# Patient Record
Sex: Male | Born: 2000 | Hispanic: Yes | Smoking: Never smoker
Health system: Southern US, Community
[De-identification: ages and names within clinical notes are randomized; demographics above are authoritative.]

---
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
## 2020-02-23 IMAGING — CT CT HEAD W/O CM
3 series · 15 of 47 positions shown, 18 images · non-contrast
Comparison: None.

CLINICAL DATA: Panic attack

EXAM:
CT HEAD WITHOUT CONTRAST
TECHNIQUE: Contiguous axial images were obtained from the base of the skull
through the vertex without intravenous contrast.

[Series 3: head 5.0 h30s · axial · 0.39mm/px · z∈[-76,+49]mm · 9 of 31 slices shown, 12 images]
[im 3/31  brain]
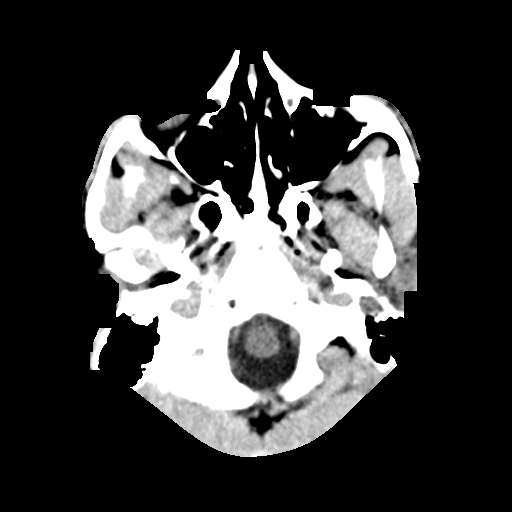
[im 3/31  bone]
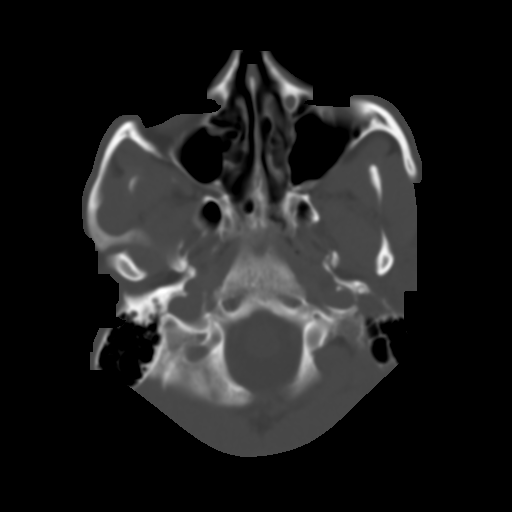
[im 6/31  brain]
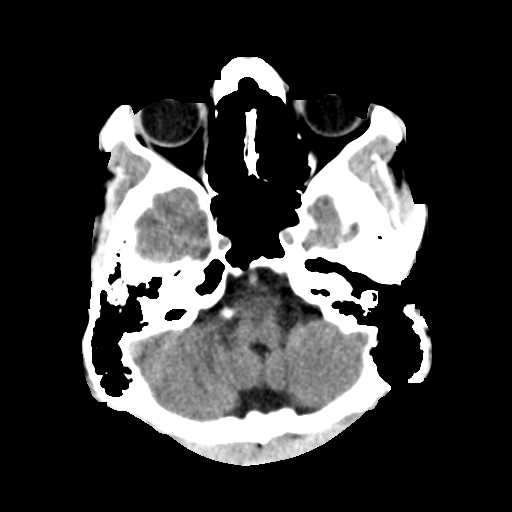
[im 9/31  brain]
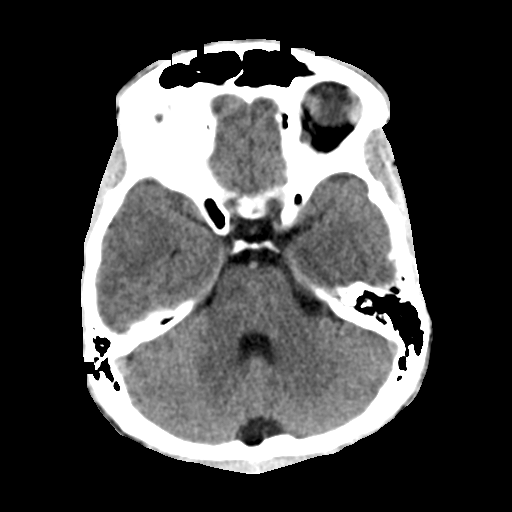
[im 12/31  brain]
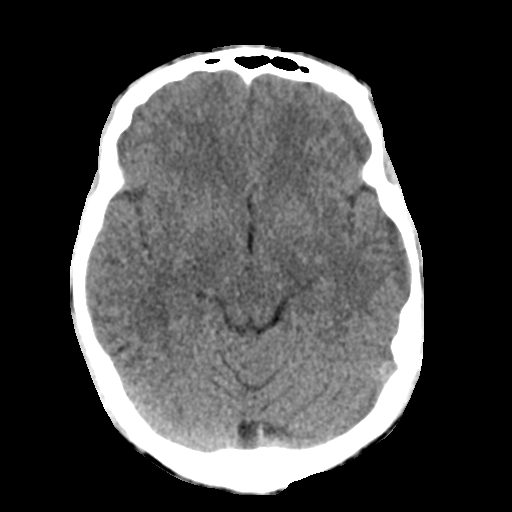
[im 16/31  brain]
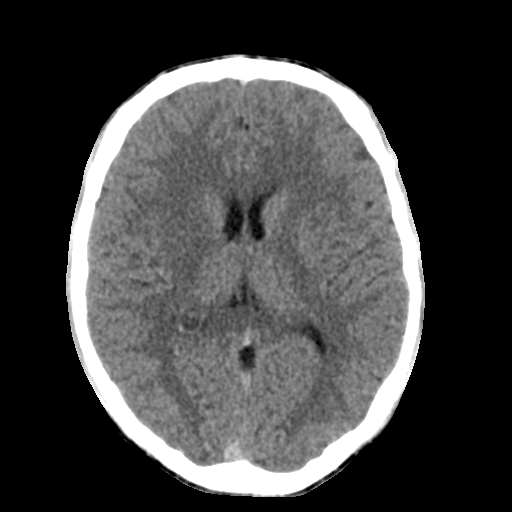
[im 16/31  bone]
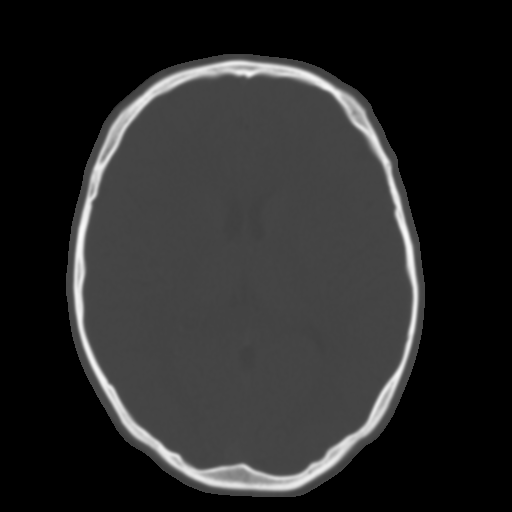
[im 19/31  brain]
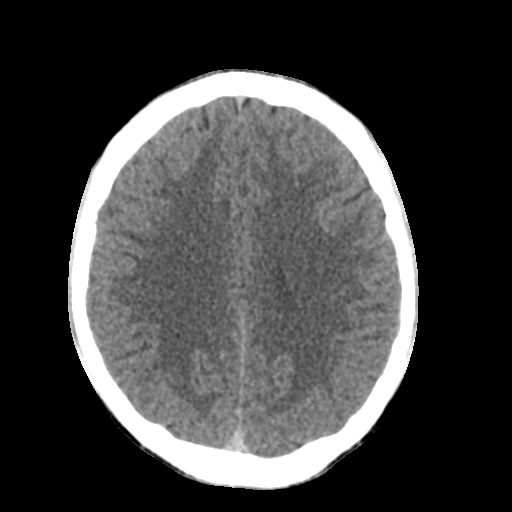
[im 22/31  brain]
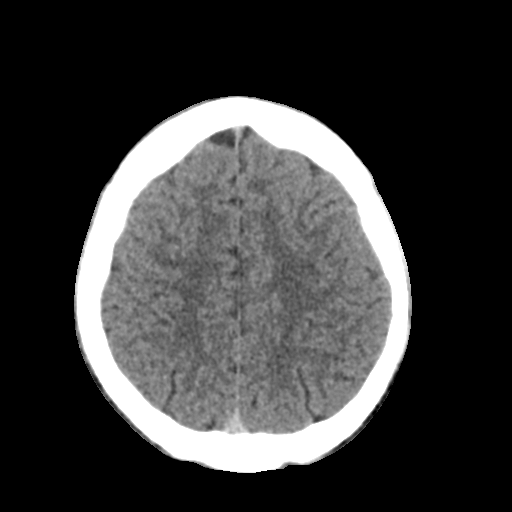
[im 25/31  brain]
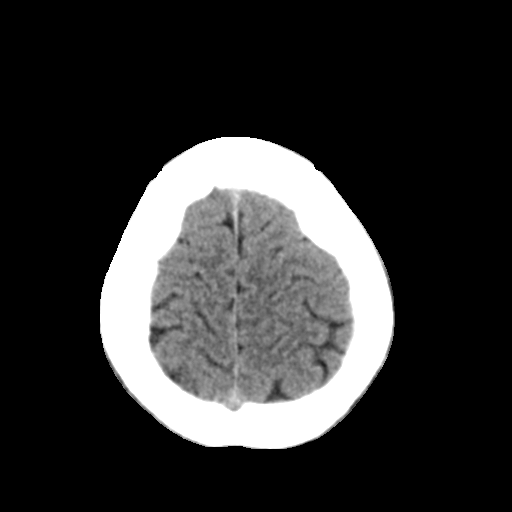
[im 28/31  brain]
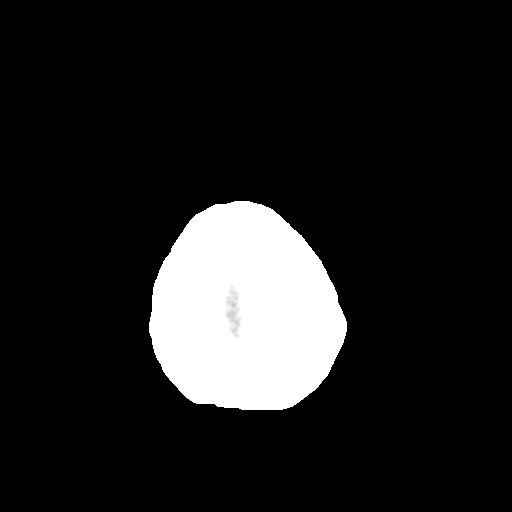
[im 28/31  bone]
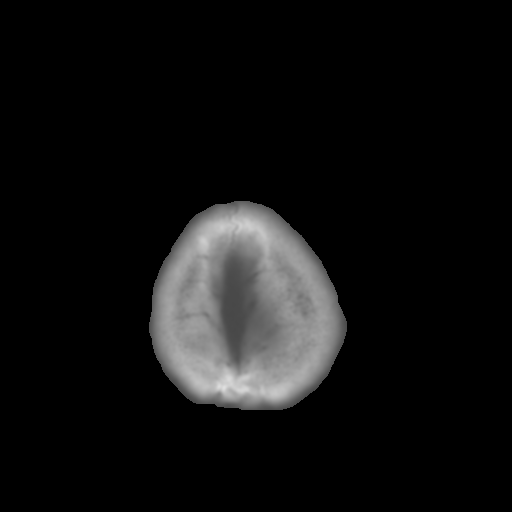

[Series 5: head 3.0 mpr cor · coronal · 0.29mm/px · 3 of 67 slices shown]
[im 23/67  brain]
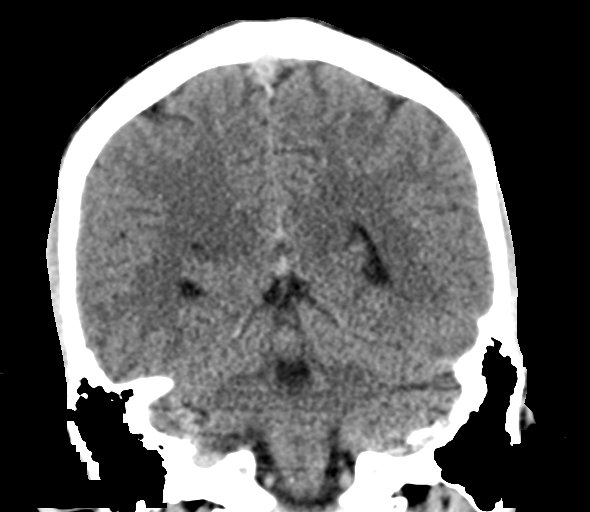
[im 30/67  brain]
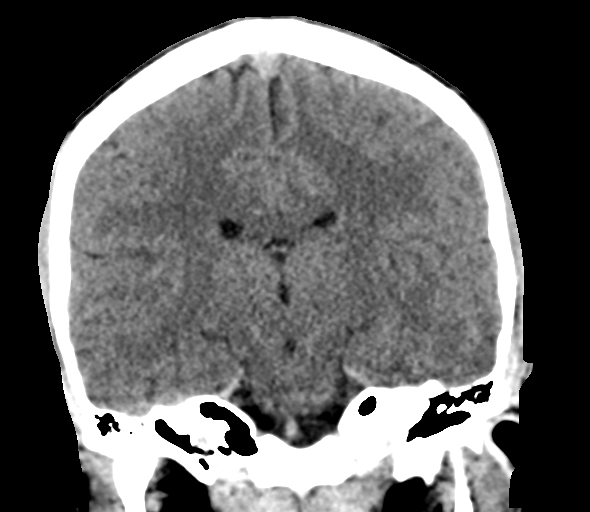
[im 37/67  brain]
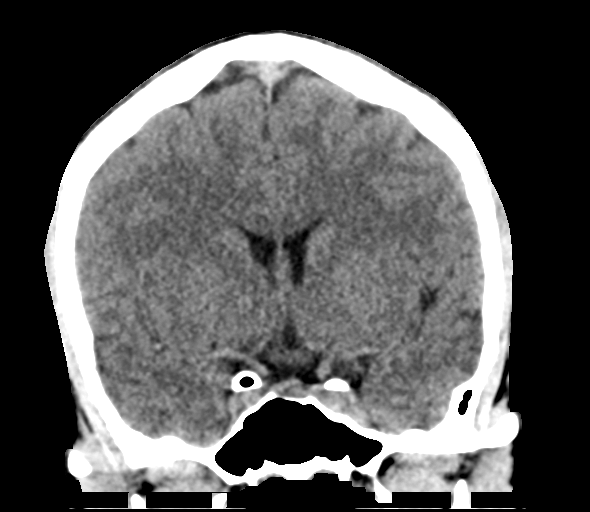

[Series 6: head 3.0 mpr sag · sagittal · 0.29mm/px · 3 of 67 slices shown]
[im 23/67  brain]
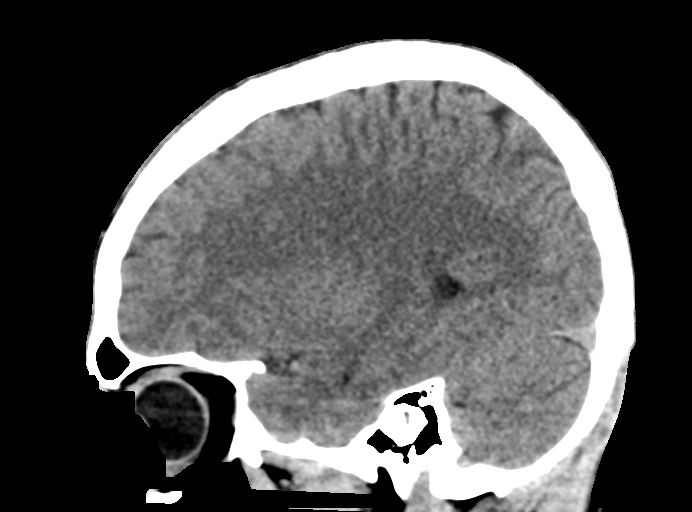
[im 34/67  brain]
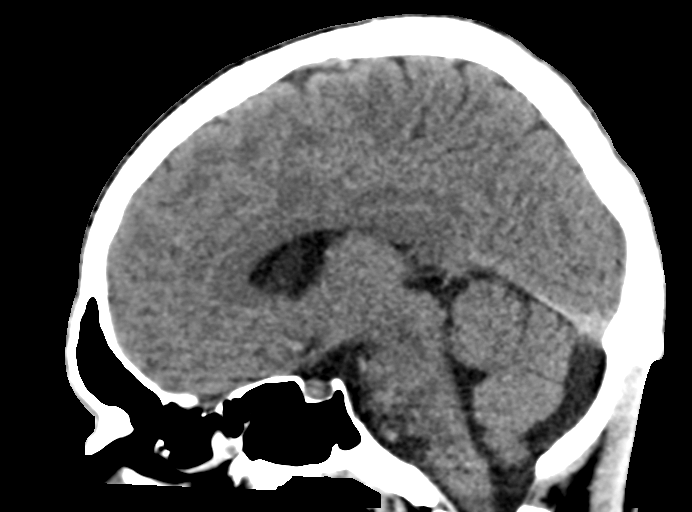
[im 45/67  brain]
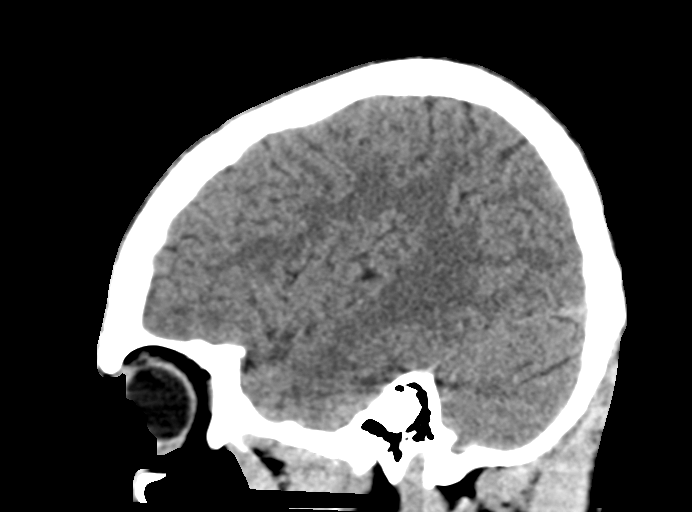

[15 of 47 positions shown; findings below may reference images not displayed]

FINDINGS: Brain: No evidence of acute infarction, hemorrhage, hydrocephalus,
extra-axial collection or mass lesion/mass effect.

Vascular: No hyperdense vessel or unexpected calcification.

Skull: Normal. Negative for fracture or focal lesion.

Sinuses/Orbits: No acute finding. Minimal mucosal thickening of the
ethmoid and left maxillary sinuses.

Other: None.
IMPRESSION: Normal head CT

## 2024-03-31 ENCOUNTER — Emergency Department (HOSPITAL_COMMUNITY)
Admission: EM | Admit: 2024-03-31 | Discharge: 2024-04-01 | Disposition: A | Discharge status: Home / Self Care | Payer: Self-pay | Attending: Emergency Medicine | Admitting: Emergency Medicine

## 2024-03-31 ENCOUNTER — Other Ambulatory Visit: Payer: Self-pay

## 2024-03-31 DIAGNOSIS — S0501XA Injury of conjunctiva and corneal abrasion without foreign body, right eye, initial encounter: Secondary | ICD-10-CM | POA: Insufficient documentation

## 2024-03-31 DIAGNOSIS — M79651 Pain in right thigh: Secondary | ICD-10-CM | POA: Insufficient documentation

## 2024-03-31 DIAGNOSIS — X18XXXA Contact with other hot metals, initial encounter: Secondary | ICD-10-CM | POA: Insufficient documentation

## 2024-03-31 NOTE — ED Triage Notes (Signed)
 Patient reports he got a a small piece of metal in his eye. He states its in the left corner of his right eye. Eye is watering and pain when he looks to the side. He states it is burning and looks like he has a glare in his eye.

## 2024-03-31 NOTE — ED Provider Triage Note (Signed)
 Emergency Medicine Provider Triage Evaluation Note  Stephen Rice , a 23 y.o. male  was evaluated in triage.  Pt complains of in the right eye, was doing some work with a friend when he had a piece of debris fly into his right eye causing him to have pain, redness, and increased tearing of the same.  States that he felt that there was a small piece of something in the eye however has not been able to remove it..  Review of Systems  Positive: As above Negative:   Physical Exam  BP 131/63 (BP Location: Right Arm)   Pulse 74   Temp 97.9 F (36.6 C)   Resp 16   Ht 5' 5 (1.651 m)   Wt 54.4 kg   SpO2 98%   BMI 19.97 kg/m  Gen:   Awake, no distress   Resp:  Normal effort  MSK:   Moves extremities without difficulty  Other:  Everted the upper eyelid, small black speck was noted, attempted to remove with wet sterile swab, unsuccessful.  Medical Decision Making  Medically screening exam initiated at 6:55 PM.  Appropriate orders placed.  Stephen Rice was informed that the remainder of the evaluation will be completed by another provider, this initial triage assessment does not replace that evaluation, and the importance of remaining in the ED until their evaluation is complete.     Myriam Dorn [place], [place] 03/31/24 [phone]

## 2024-04-01 MED ORDER — TETRACAINE HCL 0.5 % OP SOLN
2.0000 [drp] | Freq: Once | OPHTHALMIC | Status: AC
  Administered 2024-04-01: 2 [drp] via OPHTHALMIC
  Filled 2024-04-01: qty 4

## 2024-04-01 MED ORDER — ERYTHROMYCIN 5 MG/GM OP OINT: TOPICAL_OINTMENT | OPHTHALMIC | 0 refills | Status: AC

## 2024-04-01 MED ORDER — FLUORESCEIN SODIUM 1 MG OP STRP
1.0000 | ORAL_STRIP | Freq: Once | OPHTHALMIC | Status: AC
  Administered 2024-04-01: 1 via OPHTHALMIC
  Filled 2024-04-01: qty 1

## 2024-04-01 NOTE — ED Provider Notes (Signed)
 [place] [facility] AT [facility] Provider Note   CSN: [id number] Arrival date & time: 03/31/24  1818     Patient presents with: No chief complaint on file.   Stephen Rice is a 23 y.o. male.   HPI  Patient is a 23 year old male.  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  He states that he had sudden onset of right eye pain he was not wearing eye protection does not wear contact lenses.  He immediately had significant discomfort in his right eye and associated watering.  He denies any blurry vision or double vision no nausea vomiting neck pain numbness or depression.      Prior to Admission medications   Medication Sig Start Date End Date Taking? Authorizing Provider  erythromycin ophthalmic ointment Place a 1/2 inch ribbon of ointment into the lower eyelid four times daily 04/01/24  Yes Chalyn Amescua, Kingman S, PA  FLUoxetine  (PROZAC ) 10 MG capsule Take 1 capsule (10 mg total) by mouth daily. Patient not taking: Reported on 03/21/2018 03/15/18   Lester, Rachael, MD  guaifenesin (ROBITUSSIN) 100 MG/5ML syrup Take 200 mg by mouth 3 (three) times daily as needed for cough.    [provider]  Melatonin 3 MG TABS Take 3 mg by mouth at bedtime.    [provider]    Allergies: Patient has no known allergies.    Review of Systems  Updated Vital Signs BP 105/60 (BP Location: Left Arm)   Pulse 65   Temp 98.3 F (36.8 C)   Resp 14   Ht 5' 5 (1.651 m)   Wt 54.4 kg   SpO2 100%   BMI 19.97 kg/m   Physical Exam Vitals and nursing note reviewed.  Constitutional:      General: He is not in acute distress.    Appearance: Normal appearance. He is not ill-appearing.  HENT:     Head: Normocephalic and atraumatic.  Eyes:     General: No scleral icterus.       Right eye: No discharge.        Left eye:  No discharge.     Conjunctiva/sclera: Conjunctivae normal.     Comments: Single corneal abrasion with fluorescein uptake shown in picture below approximately 35% of cornea  Pulmonary:     Effort: Pulmonary effort is normal.     Breath sounds: No stridor.  Neurological:     Mental Status: He is alert and oriented to person, place, and time. Mental status is at baseline.     (all labs ordered are listed, but only abnormal results are displayed) Labs Reviewed - No data to display  EKG: None  Radiology: No results found.   Procedures   Medications Ordered in the ED  fluorescein ophthalmic strip 1 strip (1 strip Right Eye Given 04/01/24 0725)  tetracaine (PONTOCAINE) 0.5 % ophthalmic solution 2 drop (2 drops Right Eye Given 04/01/24 0725)                                    Medical Decision Making Risk Prescription drug management.   Patient is a 23 year old male.  Past medical history present emergency room today for right thigh pain after doing small piece of cement or metal this morning when he was on a  worksite doing holiday representative.  He states that he had sudden onset of right eye pain he was not wearing eye protection does not wear contact lenses.  He immediately had significant discomfort in his right eye and associated watering.  He denies any blurry vision or double vision no nausea vomiting neck pain numbness or depression.  Corneal abrasion of the right eye overlying the pupil approximately 35% of cornea.  EOMI, no Seidel sign.  Ocular pressures 20, 18, 22 in right eye.  Patient will follow-up in the next 24 to 48 hours with ophthalmology erythromycin ointment use/recommended.  Final diagnoses:  Abrasion of right cornea, initial encounter    ED Discharge Orders          Ordered    erythromycin ophthalmic ointment        04/01/24 0739               Neldon Hamp RAMAN, PA 04/01/24 [id number]    Stephen Lot, MD 04/01/24 1505

## 2024-04-01 NOTE — Discharge Instructions (Addendum)
 Please follow-up with the ophthalmology office today given the information for.  Use the antibiotic ointment 4 times daily for the next 5 days.  Change eyedrops/ointment if recommended by eye doctor.  Does not really like to be seen in the ophthalmology clinic in the next 24-48 hours
